# Patient Record
Sex: Male | Born: 1980 | Race: White | Hispanic: No | Marital: Married
Health system: Southern US, Community
[De-identification: ages and names within clinical notes are randomized; demographics above are authoritative.]

## PROBLEM LIST (undated history)

## (undated) DIAGNOSIS — I10 Essential (primary) hypertension: Secondary | ICD-10-CM

---

## 2016-05-02 DIAGNOSIS — Z8719 Personal history of other diseases of the digestive system: Secondary | ICD-10-CM

## 2016-05-02 DIAGNOSIS — R112 Nausea with vomiting, unspecified: Secondary | ICD-10-CM

## 2016-05-02 DIAGNOSIS — K5780 Diverticulitis of intestine, part unspecified, with perforation and abscess without bleeding: Secondary | ICD-10-CM

## 2016-05-02 DIAGNOSIS — R197 Diarrhea, unspecified: Secondary | ICD-10-CM

## 2016-05-02 DIAGNOSIS — Z8709 Personal history of other diseases of the respiratory system: Secondary | ICD-10-CM

## 2016-05-05 DIAGNOSIS — K5780 Diverticulitis of intestine, part unspecified, with perforation and abscess without bleeding: Secondary | ICD-10-CM

## 2016-07-15 DIAGNOSIS — K561 Intussusception: Secondary | ICD-10-CM

## 2016-07-15 DIAGNOSIS — K5780 Diverticulitis of intestine, part unspecified, with perforation and abscess without bleeding: Secondary | ICD-10-CM

## 2016-07-15 DIAGNOSIS — R740 Nonspecific elevation of levels of transaminase and lactic acid dehydrogenase [LDH]: Secondary | ICD-10-CM

## 2016-07-15 DIAGNOSIS — Z8709 Personal history of other diseases of the respiratory system: Secondary | ICD-10-CM

## 2020-11-13 DIAGNOSIS — R232 Flushing: Secondary | ICD-10-CM | POA: Diagnosis not present

## 2020-11-13 DIAGNOSIS — I10 Essential (primary) hypertension: Secondary | ICD-10-CM | POA: Diagnosis not present

## 2020-11-13 DIAGNOSIS — G4710 Hypersomnia, unspecified: Secondary | ICD-10-CM | POA: Diagnosis not present

## 2020-11-13 DIAGNOSIS — F4323 Adjustment disorder with mixed anxiety and depressed mood: Secondary | ICD-10-CM | POA: Diagnosis not present

## 2020-11-13 DIAGNOSIS — Z13220 Encounter for screening for lipoid disorders: Secondary | ICD-10-CM | POA: Diagnosis not present

## 2020-11-29 DIAGNOSIS — G4730 Sleep apnea, unspecified: Secondary | ICD-10-CM | POA: Diagnosis not present

## 2021-02-19 DIAGNOSIS — H00024 Hordeolum internum left upper eyelid: Secondary | ICD-10-CM | POA: Diagnosis not present

## 2021-07-24 ENCOUNTER — Other Ambulatory Visit: Payer: Self-pay

## 2021-07-24 ENCOUNTER — Emergency Department (HOSPITAL_COMMUNITY): Payer: BC Managed Care – PPO

## 2021-07-24 ENCOUNTER — Encounter (HOSPITAL_COMMUNITY): Payer: Self-pay | Admitting: Emergency Medicine

## 2021-07-24 ENCOUNTER — Emergency Department (HOSPITAL_COMMUNITY)
Admission: EM | Admit: 2021-07-24 | Discharge: 2021-07-24 | Disposition: A | Discharge status: Home / Self Care | Payer: BC Managed Care – PPO | Attending: Emergency Medicine | Admitting: Emergency Medicine

## 2021-07-24 DIAGNOSIS — R0789 Other chest pain: Secondary | ICD-10-CM | POA: Insufficient documentation

## 2021-07-24 DIAGNOSIS — I10 Essential (primary) hypertension: Secondary | ICD-10-CM | POA: Insufficient documentation

## 2021-07-24 DIAGNOSIS — Z79899 Other long term (current) drug therapy: Secondary | ICD-10-CM | POA: Insufficient documentation

## 2021-07-24 DIAGNOSIS — R5383 Other fatigue: Secondary | ICD-10-CM | POA: Diagnosis not present

## 2021-07-24 DIAGNOSIS — R000 Tachycardia, unspecified: Secondary | ICD-10-CM | POA: Insufficient documentation

## 2021-07-24 DIAGNOSIS — R079 Chest pain, unspecified: Secondary | ICD-10-CM

## 2021-07-24 HISTORY — DX: Essential (primary) hypertension: I10

## 2021-07-24 LAB — CBC
HCT: 48.1 % (ref 39.0–52.0)
Hemoglobin: 15.9 g/dL (ref 13.0–17.0)
MCH: 31 pg (ref 26.0–34.0)
MCHC: 33.1 g/dL (ref 30.0–36.0)
MCV: 93.8 fL (ref 80.0–100.0)
Platelets: 189 10*3/uL (ref 150–400)
RBC: 5.13 MIL/uL (ref 4.22–5.81)
RDW: 12 % (ref 11.5–15.5)
WBC: 6.7 10*3/uL (ref 4.0–10.5)
nRBC: 0 % (ref 0.0–0.2)

## 2021-07-24 LAB — BASIC METABOLIC PANEL
Anion gap: 7 (ref 5–15)
BUN: 22 mg/dL — ABNORMAL HIGH (ref 6–20)
CO2: 26 mmol/L (ref 22–32)
Calcium: 9.2 mg/dL (ref 8.9–10.3)
Chloride: 105 mmol/L (ref 98–111)
Creatinine, Ser: 0.85 mg/dL (ref 0.61–1.24)
GFR, Estimated: >60 mL/min (ref >60)
Glucose, Bld: 150 mg/dL — ABNORMAL HIGH (ref 70–99)
Potassium: 4.4 mmol/L (ref 3.5–5.1)
Sodium: 138 mmol/L (ref 135–145)

## 2021-07-24 LAB — TROPONIN I (HIGH SENSITIVITY)
Troponin I (High Sensitivity): 3 ng/L (ref <18)
Troponin I (High Sensitivity): 3 ng/L (ref <18)

## 2021-07-24 NOTE — ED Provider Triage Note (Signed)
Emergency Medicine Provider Triage Evaluation Note ? ?Andres Gilmore , a 41 y.o. male  was evaluated in triage.  Pt complains of chest pain. States that same has been ongoing for the past week but worsening this week with the warmer temperatures. States that he was at work earlier during the week and as the temperature got up to the 80s he began to have left-sided chest pain that radiated down his arm with associated shortness of breath.  He also states that he had a few episodes of nausea and vomiting as well.  States that a few days ago he had chest pain that also woke him from sleep.  No history of heart problems, states that he was worked up after a similar event at [facility] without acute findings. ? ?Review of Systems  ?Positive: Chest pain, shortness of breath ?Negative: Fevers, chills ? ?Physical Exam  ?BP (!) 136/103 (BP Location: Right Arm)   Pulse (!) 103   Temp (!) 97.5 ?F (36.4 ?C) (Oral)   Resp 16   SpO2 100%  ?Gen:   Awake, no distress   ?Resp:  Normal effort  ?MSK:   Moves extremities without difficulty  ?Other:   ? ?Medical Decision Making  ?Medically screening exam initiated at 9:49 AM.  Appropriate orders placed.  Andres Gilmore was informed that the remainder of the evaluation will be completed by another provider, this initial triage assessment does not replace that evaluation, and the importance of remaining in the ED until their evaluation is complete. ? ? ?  ?Silva Bandy, PA-C ?07/24/21 7989 ? ?

## 2021-07-24 NOTE — Discharge Instructions (Signed)
You have been seen and discharged from the emergency department.  Your cardiac work-up was normal.  Follow-up with your primary provider for further evaluation and further care. Take home medications as prescribed. If you have any worsening symptoms or further concerns for your health please return to an emergency department for further evaluation. ?

## 2021-07-24 NOTE — ED Provider Notes (Signed)
?[facility] EMERGENCY DEPARTMENT ?Provider Note ? ? ?CSN: [id number] ?Arrival date & time: 07/24/21  0930 ? ?  ? ?History ? ?Chief Complaint  ?Patient presents with  ? Chest Pain  ? ? ?Andres Gilmore is a 41 y.o. male. ? ?HPI ? ?41 year old male with past medical history of HTN presents emergency department after episodes of chest pain.  Patient states a couple days ago while was hot he was outside working at [employer] chart for his job.  He states he felt overheated, at times nauseous with intermittent left-sided chest pain.  This self resolved.  Denies any associated shortness of breath.  Patient states he is currently chest pain-free.  No recent cough, hemoptysis.  No swelling of his lower extremities.  No history of cardiac disease.  No active risk factors for DVT/PE. ? ?Home Medications ?Prior to Admission medications   ?Medication Sig Start Date End Date Taking? Authorizing Provider  ?aspirin-acetaminophen-caffeine (EXCEDRIN MIGRAINE) 250-250-65 MG tablet Take 1 tablet by mouth every 6 (six) hours as needed for headache.   Yes [provider]  ?lisinopril (ZESTRIL) 10 MG tablet Take 10 mg by mouth daily.   Yes [provider]  ?   ? ?Allergies    ?Patient has no known allergies.   ? ?Review of Systems   ?Review of Systems  ?Constitutional:  Negative for fatigue and fever.  ?Respiratory:  Negative for shortness of breath.   ?Cardiovascular:  Positive for chest pain. Negative for palpitations and leg swelling.  ?Gastrointestinal:  Negative for abdominal pain, diarrhea and vomiting.  ?Skin:  Negative for rash.  ?Neurological:  Negative for syncope and headaches.  ? ?Physical Exam ?Updated Vital Signs ?BP 120/80 (BP Location: Right Arm)   Pulse 70   Temp 98 ?F (36.7 ?C) (Oral)   Resp 16   SpO2 100%  ?Physical Exam ?Vitals and nursing note reviewed.  ?Constitutional:   ?   General: He is not in acute distress. ?   Appearance: Normal appearance. He is not diaphoretic.  ?HENT:  ?    Head: Normocephalic.  ?   Mouth/Throat:  ?   Mouth: Mucous membranes are moist.  ?Cardiovascular:  ?   Rate and Rhythm: Normal rate.  ?Pulmonary:  ?   Effort: Pulmonary effort is normal. No tachypnea or respiratory distress.  ?   Breath sounds: No decreased breath sounds.  ?Abdominal:  ?   Palpations: Abdomen is soft.  ?   Tenderness: There is no abdominal tenderness.  ?Musculoskeletal:  ?   Right lower leg: No edema.  ?   Left lower leg: No edema.  ?Skin: ?   General: Skin is warm.  ?Neurological:  ?   Mental Status: He is alert and oriented to person, place, and time. Mental status is at baseline.  ?Psychiatric:     ?   Mood and Affect: Mood normal.  ? ? ?ED Results / Procedures / Treatments   ?Labs ?(all labs ordered are listed, but only abnormal results are displayed) ?Labs Reviewed  ?BASIC METABOLIC PANEL - Abnormal; Notable for the following components:  ?    Result Value  ? Glucose, Bld 150 (*)   ? BUN 22 (*)   ? All other components within normal limits  ?CBC  ?TROPONIN I (HIGH SENSITIVITY)  ?TROPONIN I (HIGH SENSITIVITY)  ? ? ?EKG ?EKG Interpretation ? ?Date/Time:  Friday July 24 2021 09:35:51 EDT ?Ventricular Rate:  93 ?PR Interval:  126 ?QRS Duration: 86 ?QT Interval:  326 ?QTC Calculation: 405 ?R Axis:   67 ?Text Interpretation: Normal sinus rhythm Normal ECG No previous ECGs available Confirmed by Coralee Pesa [phone]) on 07/24/2021 1:25:52 PM ? ?Radiology ?DG Chest 2 View ? ?Result Date: 07/24/2021 ?CLINICAL DATA:  Chest pain radiation to left arm a few weeks brought on by exertion. Some shortness-of-breath. EXAM: CHEST - 2 VIEW COMPARISON:  09/25/2020 FINDINGS: Lungs are adequately inflated and otherwise clear. Cardiomediastinal silhouette is normal. Remainder of the exam is unchanged. IMPRESSION: 1. No active cardiopulmonary disease. Electronically Signed   By: Elberta Fortis M.D.   On: 07/24/2021 10:01   ? ?Procedures ?Procedures  ? ? ?Medications Ordered in ED ?Medications - No data to display ? ?ED  Course/ Medical Decision Making/ A&P ?  ?                        ?Medical Decision Making ?Amount and/or Complexity of Data Reviewed ?Labs: ordered. ?Radiology: ordered. ? ? ?41 year old male presents emergency department after episodes of chest pain while working outside in the hot sun.  Patient fell like he got overheated but was encouraged to come to the ER for evaluation by urgent care/primary doctor. ? ?Patient was tachycardic at 103 on arrival but this self resolved, EKG is normal sinus rhythm, no concerning changes or rightward axis.  No ischemic changes.  Blood work is reassuring with no acute abnormalities, troponin is negative with no delta.  Again no active chest pain.  Very low suspicion for PE in the absence of shortness of breath/hypoxia.  I do not believe that that triage tachycardia is any reason for concern in regards to PE. No signs of vascular problem. Patient is completely asymptomatic and well-appearing. ? ?Patient at this time appears safe and stable for discharge and close outpatient follow up. Discharge plan and strict return to ED precautions discussed, patient verbalizes understanding and agreement. ? ? ? ? ? ? ? ?Final Clinical Impression(s) / ED Diagnoses ?Final diagnoses:  ?Chest pain, unspecified type  ? ? ?Rx / DC Orders ?ED Discharge Orders   ? ? None  ? ?  ? ? ?  ?Rozelle Logan, DO ?07/24/21 1449 ? ?

## 2021-07-24 NOTE — ED Triage Notes (Signed)
Patient here with complaint of intermittent chest pain with radiation into left arm that started a few weeks ago. Pain is brought on by exertion and is accompanied by shortness of breath and dizziness. Patient denies symptoms at rest. Patient is alert, oriented, ambulatory, and in no apparent distress at this time. ?

## 2022-04-27 DIAGNOSIS — E1165 Type 2 diabetes mellitus with hyperglycemia: Secondary | ICD-10-CM | POA: Diagnosis not present

## 2022-07-01 DIAGNOSIS — S61217A Laceration without foreign body of left little finger without damage to nail, initial encounter: Secondary | ICD-10-CM | POA: Diagnosis not present

## 2022-07-01 DIAGNOSIS — W260XXA Contact with knife, initial encounter: Secondary | ICD-10-CM | POA: Diagnosis not present

## 2022-07-12 DIAGNOSIS — Z6836 Body mass index (BMI) 36.0-36.9, adult: Secondary | ICD-10-CM | POA: Diagnosis not present

## 2022-07-12 DIAGNOSIS — Z4802 Encounter for removal of sutures: Secondary | ICD-10-CM | POA: Diagnosis not present

## 2022-07-12 DIAGNOSIS — E1165 Type 2 diabetes mellitus with hyperglycemia: Secondary | ICD-10-CM | POA: Diagnosis not present

## 2022-08-20 IMAGING — CR DG CHEST 2V
2 series · 2 of 2 positions shown · non-contrast
Comparison: 09/25/2020

CLINICAL DATA: Chest pain radiation to left arm a few weeks brought
on by exertion. Some shortness-of-breath.

EXAM:
CHEST - 2 VIEW

[chest pa]
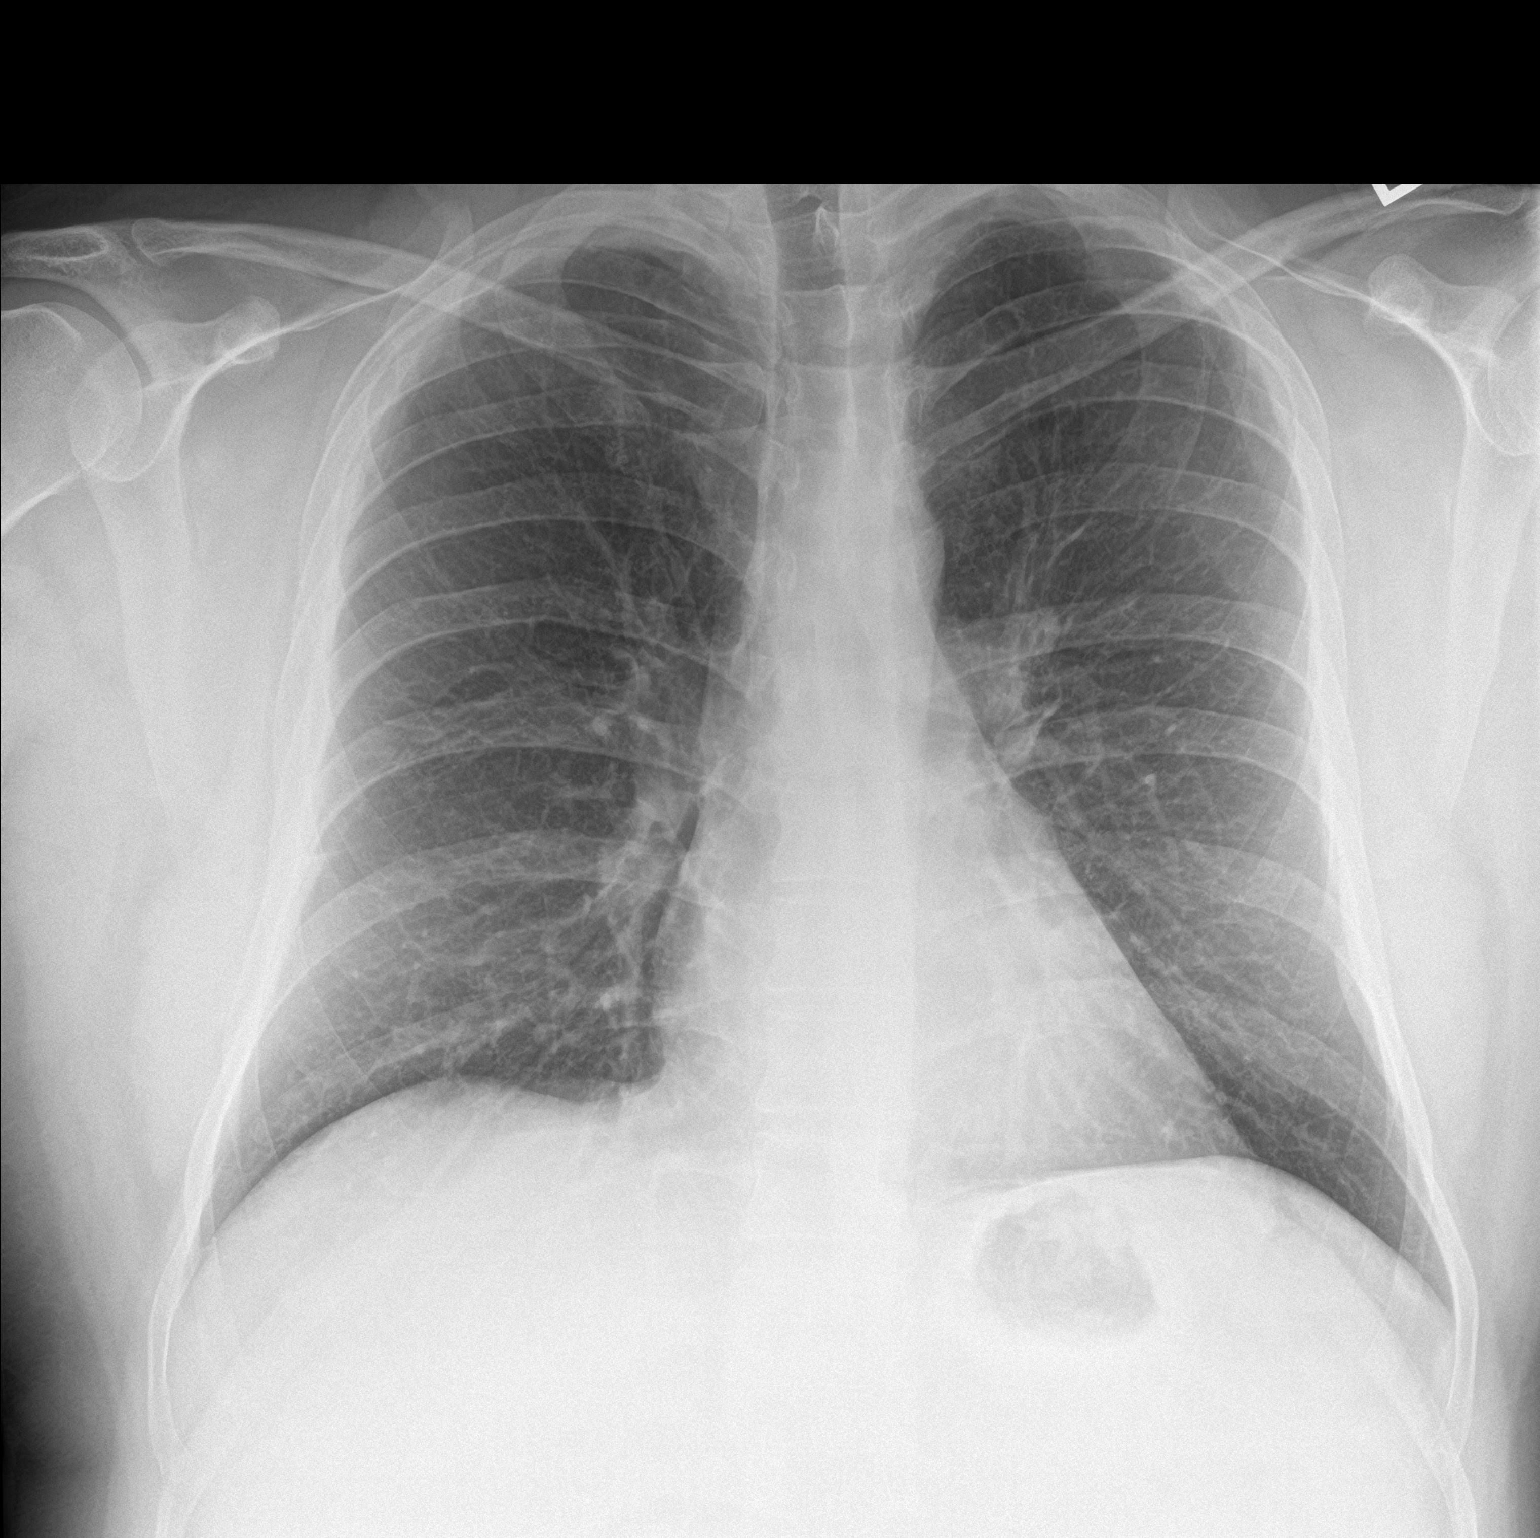

[chest lat]
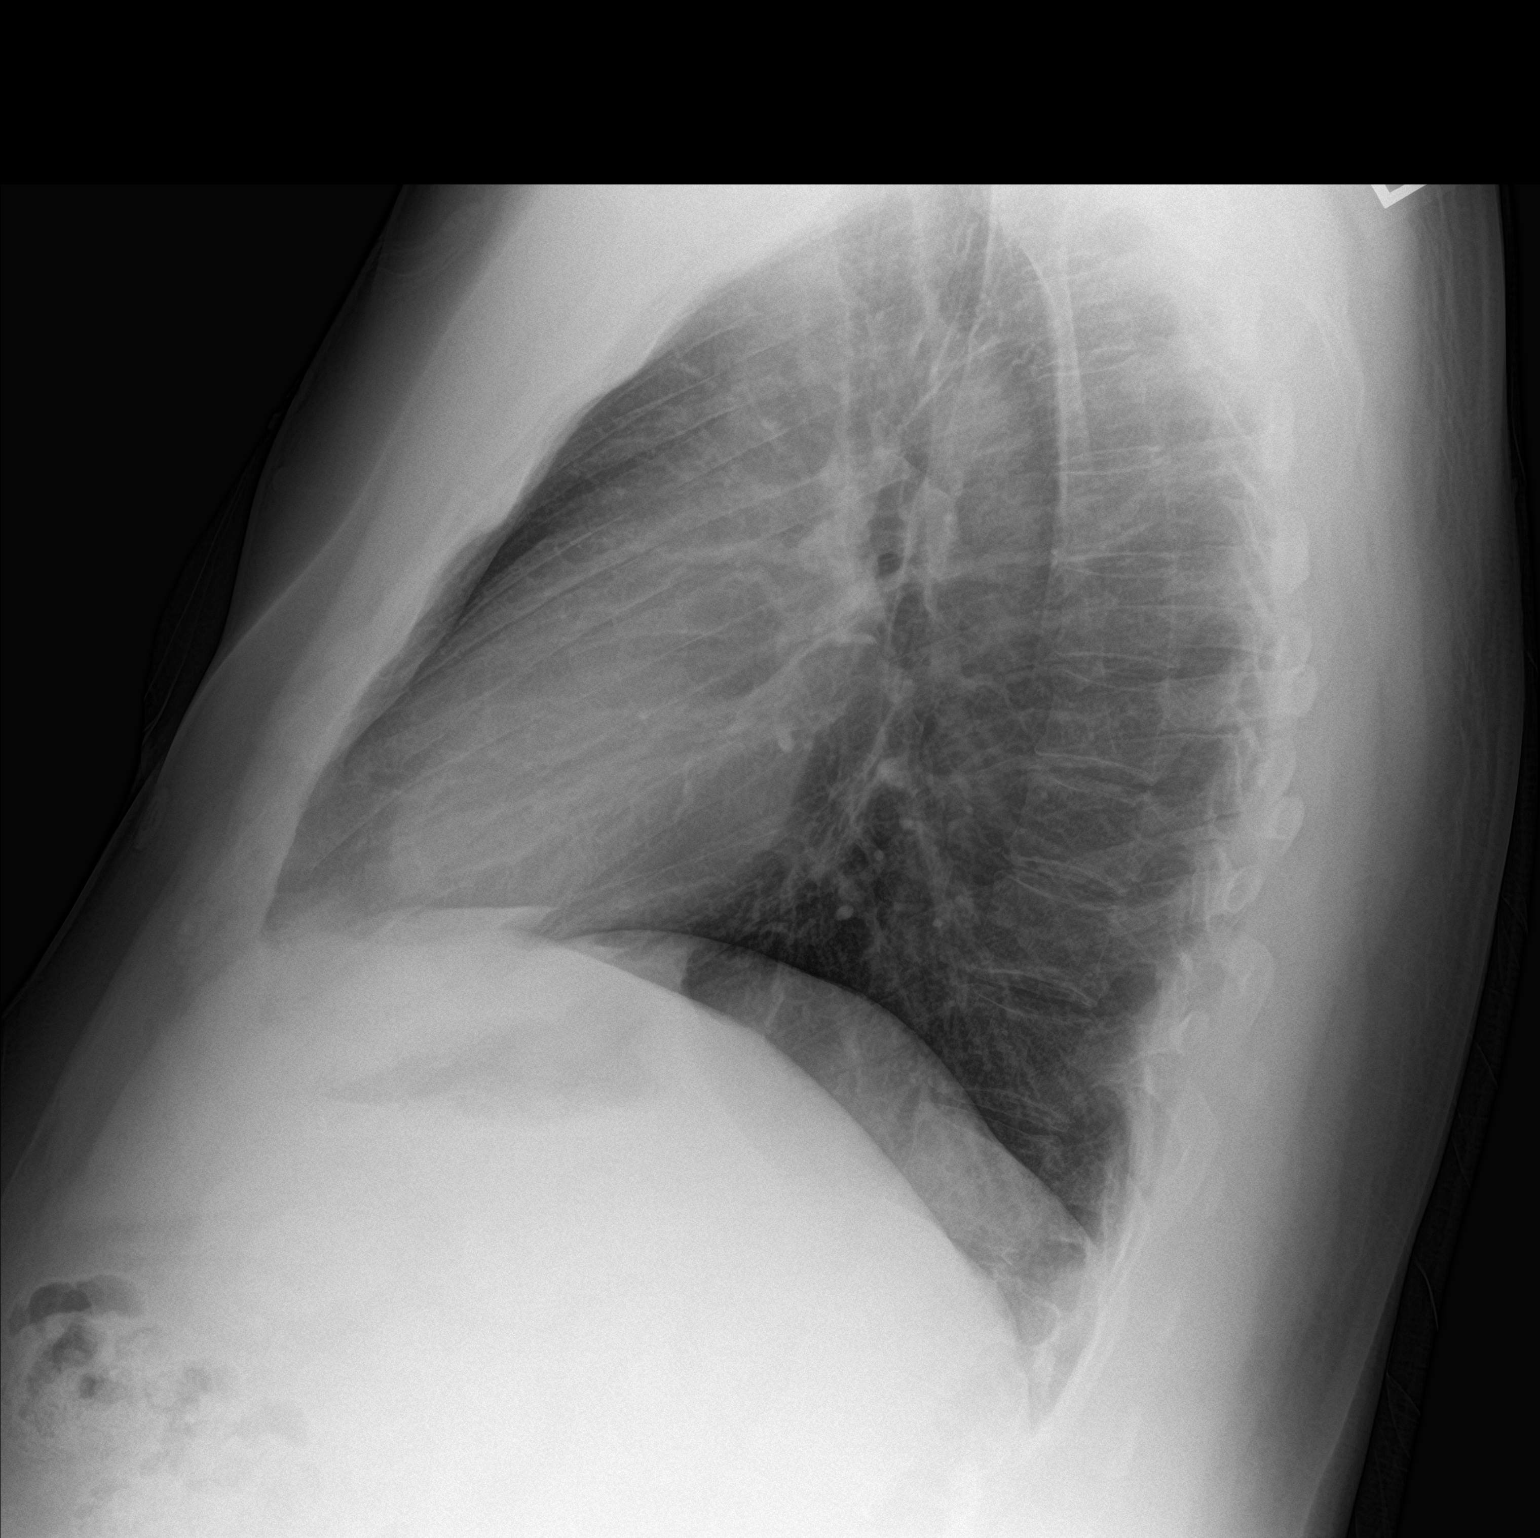

[2 of 2 positions shown; findings below may reference images not displayed]

FINDINGS: Lungs are adequately inflated and otherwise clear. Cardiomediastinal
silhouette is normal. Remainder of the exam is unchanged.
IMPRESSION: 1. No active cardiopulmonary disease.

## 2022-09-16 DIAGNOSIS — Z6835 Body mass index (BMI) 35.0-35.9, adult: Secondary | ICD-10-CM | POA: Diagnosis not present

## 2022-09-16 DIAGNOSIS — E1165 Type 2 diabetes mellitus with hyperglycemia: Secondary | ICD-10-CM | POA: Diagnosis not present

## 2022-09-16 DIAGNOSIS — Z1339 Encounter for screening examination for other mental health and behavioral disorders: Secondary | ICD-10-CM | POA: Diagnosis not present

## 2022-09-16 DIAGNOSIS — Z1331 Encounter for screening for depression: Secondary | ICD-10-CM | POA: Diagnosis not present

## 2022-12-16 DIAGNOSIS — E1165 Type 2 diabetes mellitus with hyperglycemia: Secondary | ICD-10-CM | POA: Diagnosis not present

## 2022-12-16 DIAGNOSIS — Z6835 Body mass index (BMI) 35.0-35.9, adult: Secondary | ICD-10-CM | POA: Diagnosis not present

## 2023-05-23 DIAGNOSIS — Z6835 Body mass index (BMI) 35.0-35.9, adult: Secondary | ICD-10-CM | POA: Diagnosis not present

## 2023-05-23 DIAGNOSIS — R202 Paresthesia of skin: Secondary | ICD-10-CM | POA: Diagnosis not present

## 2023-05-23 DIAGNOSIS — E1165 Type 2 diabetes mellitus with hyperglycemia: Secondary | ICD-10-CM | POA: Diagnosis not present

## 2023-06-13 DIAGNOSIS — L918 Other hypertrophic disorders of the skin: Secondary | ICD-10-CM | POA: Diagnosis not present

## 2023-06-30 DIAGNOSIS — R110 Nausea: Secondary | ICD-10-CM | POA: Diagnosis not present

## 2023-07-06 DIAGNOSIS — R1110 Vomiting, unspecified: Secondary | ICD-10-CM | POA: Diagnosis not present

## 2023-07-06 DIAGNOSIS — K591 Functional diarrhea: Secondary | ICD-10-CM | POA: Diagnosis not present

## 2023-07-08 DIAGNOSIS — Z6834 Body mass index (BMI) 34.0-34.9, adult: Secondary | ICD-10-CM | POA: Diagnosis not present

## 2023-07-08 DIAGNOSIS — R112 Nausea with vomiting, unspecified: Secondary | ICD-10-CM | POA: Diagnosis not present

## 2023-07-08 DIAGNOSIS — E1165 Type 2 diabetes mellitus with hyperglycemia: Secondary | ICD-10-CM | POA: Diagnosis not present

## 2023-09-01 DIAGNOSIS — E1165 Type 2 diabetes mellitus with hyperglycemia: Secondary | ICD-10-CM | POA: Diagnosis not present

## 2023-09-01 DIAGNOSIS — R197 Diarrhea, unspecified: Secondary | ICD-10-CM | POA: Diagnosis not present

## 2023-09-01 DIAGNOSIS — Z6835 Body mass index (BMI) 35.0-35.9, adult: Secondary | ICD-10-CM | POA: Diagnosis not present

## 2023-10-27 DIAGNOSIS — Z1331 Encounter for screening for depression: Secondary | ICD-10-CM | POA: Diagnosis not present

## 2023-10-27 DIAGNOSIS — Z6834 Body mass index (BMI) 34.0-34.9, adult: Secondary | ICD-10-CM | POA: Diagnosis not present

## 2023-10-27 DIAGNOSIS — E1165 Type 2 diabetes mellitus with hyperglycemia: Secondary | ICD-10-CM | POA: Diagnosis not present

## 2023-10-27 DIAGNOSIS — Z1339 Encounter for screening examination for other mental health and behavioral disorders: Secondary | ICD-10-CM | POA: Diagnosis not present

## 2023-12-15 DIAGNOSIS — S90211A Contusion of right great toe with damage to nail, initial encounter: Secondary | ICD-10-CM | POA: Diagnosis not present
